# Patient Record
Sex: Male | Born: 1965 | Race: Black or African American | Hispanic: No | Marital: Single | Smoking: Former smoker
Health system: Southern US, Community
[De-identification: ages and names within clinical notes are randomized; demographics above are authoritative.]

## PROBLEM LIST (undated history)

## (undated) DIAGNOSIS — C679 Malignant neoplasm of bladder, unspecified: Secondary | ICD-10-CM

## (undated) DIAGNOSIS — J45909 Unspecified asthma, uncomplicated: Secondary | ICD-10-CM

## (undated) HISTORY — PX: BLADDER SURGERY: SHX569

---

## 2014-07-02 ENCOUNTER — Encounter (HOSPITAL_BASED_OUTPATIENT_CLINIC_OR_DEPARTMENT_OTHER): Payer: Self-pay

## 2014-07-02 ENCOUNTER — Emergency Department (HOSPITAL_BASED_OUTPATIENT_CLINIC_OR_DEPARTMENT_OTHER)
Admission: EM | Admit: 2014-07-02 | Discharge: 2014-07-02 | Disposition: A | Discharge status: Home / Self Care | Payer: PRIVATE HEALTH INSURANCE | Attending: Emergency Medicine | Admitting: Emergency Medicine

## 2014-07-02 ENCOUNTER — Emergency Department (HOSPITAL_BASED_OUTPATIENT_CLINIC_OR_DEPARTMENT_OTHER): Payer: PRIVATE HEALTH INSURANCE

## 2014-07-02 DIAGNOSIS — R1030 Lower abdominal pain, unspecified: Secondary | ICD-10-CM | POA: Insufficient documentation

## 2014-07-02 DIAGNOSIS — R319 Hematuria, unspecified: Secondary | ICD-10-CM

## 2014-07-02 DIAGNOSIS — R109 Unspecified abdominal pain: Secondary | ICD-10-CM

## 2014-07-02 DIAGNOSIS — R1033 Periumbilical pain: Secondary | ICD-10-CM

## 2014-07-02 DIAGNOSIS — N3289 Other specified disorders of bladder: Secondary | ICD-10-CM | POA: Insufficient documentation

## 2014-07-02 DIAGNOSIS — Z87891 Personal history of nicotine dependence: Secondary | ICD-10-CM | POA: Diagnosis not present

## 2014-07-02 DIAGNOSIS — Z79899 Other long term (current) drug therapy: Secondary | ICD-10-CM | POA: Insufficient documentation

## 2014-07-02 DIAGNOSIS — J45909 Unspecified asthma, uncomplicated: Secondary | ICD-10-CM | POA: Insufficient documentation

## 2014-07-02 HISTORY — DX: Unspecified asthma, uncomplicated: J45.909

## 2014-07-02 LAB — COMPREHENSIVE METABOLIC PANEL
ALK PHOS: 53 U/L (ref 39–117)
ALT: 59 U/L — ABNORMAL HIGH (ref 0–53)
AST: 45 U/L — ABNORMAL HIGH (ref 0–37)
Albumin: 3.8 g/dL (ref 3.5–5.2)
Anion gap: 6 (ref 5–15)
BILIRUBIN TOTAL: 0.3 mg/dL (ref 0.3–1.2)
BUN: 8 mg/dL (ref 6–23)
CALCIUM: 9.1 mg/dL (ref 8.4–10.5)
CHLORIDE: 102 mmol/L (ref 96–112)
CO2: 27 mmol/L (ref 19–32)
CREATININE: 1.09 mg/dL (ref 0.50–1.35)
GFR calc non Af Amer: 77 mL/min — ABNORMAL LOW (ref >90)
GFR, EST AFRICAN AMERICAN: 89 mL/min — AB (ref >90)
Glucose, Bld: 128 mg/dL — ABNORMAL HIGH (ref 70–99)
Potassium: 3.4 mmol/L — ABNORMAL LOW (ref 3.5–5.1)
Sodium: 135 mmol/L (ref 135–145)
TOTAL PROTEIN: 7.8 g/dL (ref 6.0–8.3)

## 2014-07-02 LAB — CBC WITH DIFFERENTIAL/PLATELET
Basophils Absolute: 0 10*3/uL (ref 0.0–0.1)
Basophils Relative: 0 % (ref 0–1)
EOS ABS: 0.3 10*3/uL (ref 0.0–0.7)
Eosinophils Relative: 4 % (ref 0–5)
HCT: 41.1 % (ref 39.0–52.0)
Hemoglobin: 14.4 g/dL (ref 13.0–17.0)
LYMPHS PCT: 32 % (ref 12–46)
Lymphs Abs: 2.4 10*3/uL (ref 0.7–4.0)
MCH: 31.4 pg (ref 26.0–34.0)
MCHC: 35 g/dL (ref 30.0–36.0)
MCV: 89.7 fL (ref 78.0–100.0)
MONO ABS: 0.8 10*3/uL (ref 0.1–1.0)
MONOS PCT: 10 % (ref 3–12)
NEUTROS ABS: 4 10*3/uL (ref 1.7–7.7)
Neutrophils Relative %: 54 % (ref 43–77)
PLATELETS: 215 10*3/uL (ref 150–400)
RBC: 4.58 MIL/uL (ref 4.22–5.81)
RDW: 13.1 % (ref 11.5–15.5)
WBC: 7.5 10*3/uL (ref 4.0–10.5)

## 2014-07-02 LAB — URINE MICROSCOPIC-ADD ON

## 2014-07-02 LAB — URINALYSIS, ROUTINE W REFLEX MICROSCOPIC
Bilirubin Urine: NEGATIVE
Glucose, UA: NEGATIVE mg/dL
Ketones, ur: NEGATIVE mg/dL
Leukocytes, UA: NEGATIVE
NITRITE: NEGATIVE
Protein, ur: NEGATIVE mg/dL
SPECIFIC GRAVITY, URINE: 1.008 (ref 1.005–1.030)
Urobilinogen, UA: 0.2 mg/dL (ref 0.0–1.0)
pH: 7 (ref 5.0–8.0)

## 2014-07-02 LAB — AMYLASE: Amylase: 72 U/L (ref 0–105)

## 2014-07-02 LAB — LIPASE, BLOOD: Lipase: 39 U/L (ref 11–59)

## 2014-07-02 MED ORDER — HYDROCODONE-ACETAMINOPHEN 5-325 MG PO TABS
1.0000 | ORAL_TABLET | Freq: Four times a day (QID) | ORAL | Status: DC | PRN
Start: 2014-07-02 — End: 2017-05-23

## 2014-07-02 MED ORDER — CIPROFLOXACIN HCL 500 MG PO TABS: 500.0000 mg | ORAL_TABLET | Freq: Two times a day (BID) | ORAL | Status: DC

## 2014-07-02 MED ORDER — IOHEXOL 300 MG/ML  SOLN
100.0000 mL | Freq: Once | INTRAMUSCULAR | Status: AC | PRN
  Administered 2014-07-02: 100 mL via INTRAVENOUS

## 2014-07-02 MED ORDER — SODIUM CHLORIDE 0.9 % IV SOLN: INTRAVENOUS | Status: DC

## 2014-07-02 MED ORDER — SODIUM CHLORIDE 0.9 % IV SOLN
INTRAVENOUS | Status: DC
  Administered 2014-07-02: 14:00:00 via INTRAVENOUS

## 2014-07-02 MED ORDER — HYDROMORPHONE HCL 1 MG/ML IJ SOLN
1.0000 mg | Freq: Once | INTRAMUSCULAR | Status: AC
  Administered 2014-07-02: 1 mg via INTRAVENOUS
  Filled 2014-07-02: qty 1

## 2014-07-02 MED ORDER — IOHEXOL 300 MG/ML  SOLN
25.0000 mL | Freq: Once | INTRAMUSCULAR | Status: AC | PRN
  Administered 2014-07-02: 25 mL via ORAL

## 2014-07-02 MED ORDER — ONDANSETRON HCL 4 MG/2ML IJ SOLN
4.0000 mg | Freq: Once | INTRAMUSCULAR | Status: AC
  Administered 2014-07-02: 4 mg via INTRAVENOUS
  Filled 2014-07-02: qty 2

## 2014-07-02 MED ORDER — SODIUM CHLORIDE 0.9 % IV BOLUS (SEPSIS)
250.0000 mL | Freq: Once | INTRAVENOUS | Status: AC
  Administered 2014-07-02: 250 mL via INTRAVENOUS

## 2014-07-02 NOTE — ED Provider Notes (Addendum)
CSN: [id number]     Arrival date & time 07/02/14  1142 History   First MD Initiated Contact with Patient 07/02/14 1305     Chief Complaint  Patient presents with  . Hematuria     (Consider location/radiation/quality/duration/timing/severity/associated sxs/prior Treatment) Patient is a 49 y.o. male presenting with hematuria. The history is provided by the patient.  Hematuria Associated symptoms include abdominal pain. Pertinent negatives include no chest pain, no headaches and no shortness of breath.   patient with complaint of difficulty urination for a week and I was seen at no [facility] urgent care today noted to have hematuria. Patient also with complaint of abdominal pain suprapubic up to the periumbilical area for the past week. No nausea vomiting or diarrhea. Patient with a history of heavy alcohol use. Patient denies any radiation of the pain to the back. Patient has a feeling of not being able to completely empty his bladder.  Past Medical History  Diagnosis Date  . Asthma    History reviewed. No pertinent past surgical history. No family history on file. History  Substance Use Topics  . Smoking status: Former Research scientist (life sciences)  . Smokeless tobacco: Not on file  . Alcohol Use: Yes    Review of Systems  Constitutional: Negative for fever.  HENT: Negative for congestion.   Eyes: Negative for redness.  Respiratory: Negative for shortness of breath.   Cardiovascular: Negative for chest pain.  Gastrointestinal: Positive for abdominal pain. Negative for nausea and vomiting.  Genitourinary: Positive for hematuria and difficulty urinating.  Musculoskeletal: Negative for back pain.  Skin: Negative for rash.  Neurological: Negative for headaches.  Hematological: Does not bruise/bleed easily.  Psychiatric/Behavioral: Negative for confusion.      Allergies  Review of patient's allergies indicates no known allergies.  Home Medications   Prior to Admission medications   Medication Sig  Start Date End Date Taking? Authorizing Provider  ALBUTEROL IN Inhale into the lungs.   Yes Historical Provider, MD  ciprofloxacin (CIPRO) 500 MG tablet Take 1 tablet (500 mg total) by mouth 2 (two) times daily. 07/02/14   Fredia Sorrow, MD  HYDROcodone-acetaminophen (NORCO/VICODIN) 5-325 MG per tablet Take 1-2 tablets by mouth every 6 (six) hours as needed for moderate pain. 07/02/14   Fredia Sorrow, MD   BP 126/83 mmHg  Pulse 52  Temp(Src) 98.1 F (36.7 C) (Oral)  Resp 18  Ht 5\' 4"  (1.626 m)  Wt 235 lb (106.595 kg)  BMI 40.32 kg/m2  SpO2 98% Physical Exam  Constitutional: He is oriented to person, place, and time. He appears well-developed and well-nourished. No distress.  HENT:  Head: Normocephalic and atraumatic.  Mouth/Throat: Oropharynx is clear and moist.  Eyes: Conjunctivae and EOM are normal. Pupils are equal, round, and reactive to light.  Neck: Normal range of motion.  Cardiovascular: Normal rate, regular rhythm and normal heart sounds.   No murmur heard. Pulmonary/Chest: Effort normal and breath sounds normal.  Abdominal: Soft. Bowel sounds are normal. There is tenderness.  Musculoskeletal: Normal range of motion.  Neurological: He is alert and oriented to person, place, and time. No cranial nerve deficit. He exhibits normal muscle tone. Coordination normal.  Skin: Skin is warm.  Nursing note and vitals reviewed.   ED Course  Procedures (including critical care time) Labs Review Labs Reviewed  COMPREHENSIVE METABOLIC PANEL - Abnormal; Notable for the following:    Potassium 3.4 (*)    Glucose, Bld 128 (*)    AST 45 (*)    ALT 59 (*)  GFR calc non Af Amer 77 (*)    GFR calc Af Amer 89 (*)    All other components within normal limits  URINALYSIS, ROUTINE W REFLEX MICROSCOPIC - Abnormal; Notable for the following:    Hgb urine dipstick LARGE (*)    All other components within normal limits  CBC WITH DIFFERENTIAL/PLATELET  AMYLASE  LIPASE, BLOOD  URINE  MICROSCOPIC-ADD ON    Imaging Review Ct Abdomen Pelvis W Contrast  07/02/2014   CLINICAL DATA:  Periumbilical pain.  Hematuria  EXAM: CT ABDOMEN AND PELVIS WITH CONTRAST  TECHNIQUE: Multidetector CT imaging of the abdomen and pelvis was performed using the standard protocol following bolus administration of intravenous contrast.  CONTRAST:  49mL OMNIPAQUE IOHEXOL 300 MG/ML SOLN, 119mL OMNIPAQUE IOHEXOL 300 MG/ML SOLN  COMPARISON:  None.  FINDINGS: Lung bases are clear.  Heart size normal.  Liver is diffusely low attenuation consistent with fatty infiltration. Gallbladder and bile ducts normal. Pancreas is normal without edema or mass. Spleen is normal  Normal kidneys. No renal mass or obstruction. No renal calculi. Mild prostate enlargement. 2 cm irregular density in the base of the bladder which may represent blood clot or neoplasm. Given the history of hematuria, urologic evaluation is warranted to exclude bladder lesion.  Negative for bowel obstruction. No bowel edema identified. Appendix questionably visualized and appears normal.  Negative for free fluid.  No adenopathy.  No acute bony change.  IMPRESSION: Diffuse fatty infiltration of the liver  Negative for appendicitis. The appendix is difficult to see but is probably identified and appears normal.  2 cm bladder lesion may represent blood or tumor in the bladder. Urologic evaluation suggested.   Electronically Signed   By: Franchot Gallo M.D.   On: 07/02/2014 14:39     EKG Interpretation None      MDM   Final diagnoses:  Abdominal pain  Hematuria  Mass of bladder    Patient with complaint of suprapubic abdominal pain and periumbilical pain feeling like he is not voiding completely. And just recently noticed some blood in his urine. Patient has a history of being a heavy drinker.  Workup here her function tests without significant abnormalities. Urine consistent with hematuria. Not distinct for urinary tract infection. Urine culture  sent. CT scan now raises concerns for a mass in the bladder. This would explain the hematuria.  Patient be started on antibiotic Cipro given pain medicine and the follow-up with urology. Patient's daughter understands the need for prompt follow-up. She will call and make an appointment.  In addition patient without evidence of urinary retention. Despite him feeling like the bladder was full he only had 160 mL of urine on bladder scan. No evidence of over bladder distention.  Fredia Sorrow, MD 07/02/14 [place], MD 07/10/14 [phone]

## 2014-07-02 NOTE — Discharge Instructions (Signed)
Workup for the blood in the urine shows concern for possible bladder tumor. Prompt follow-up with urology will be important. In the meantime take the antibiotics and pain medicine as directed. No other acute findings.

## 2014-07-02 NOTE — ED Notes (Signed)
Pt returned from ct

## 2014-07-02 NOTE — ED Notes (Signed)
C/o difficult urination x 1 week-was seen at [facility] to ED for hematuria and abd pain

## 2014-07-02 NOTE — ED Notes (Signed)
Family at bedside. 

## 2014-07-02 NOTE — ED Notes (Signed)
Pt reports he developed abdominal pain that is described as moderate 1 week ago and radiates to lower abdomen. Pain has progressively worsened over the past 2 days and he developed hematuria as well.  Denies nausea/vomiting/diarrhea, or melena. No previous history of similar episode. He has a history of constipation but tells me he doesn't feel constipated today.  Abdomen is soft, however more distended than usual. He was seen at an urgent care [facility] and had a gross amount of blood in urinalysis; sent to ED for further evaluation.

## 2014-07-02 NOTE — ED Notes (Signed)
Bladder scan performed 173ml noted.  EDP informed.

## 2014-07-02 NOTE — ED Notes (Signed)
Daughter presents to the desk to request that pt drinks "a lot of energy drinks daily" and she feels the blood in his urine is related to the consumption of energy drinks.

## 2014-07-02 NOTE — ED Notes (Signed)
Pt tells me he has a 20+ year of etoh abuse and drinks approx 1/5 of vodka daily.  Last drink was 5 days ago.

## 2017-05-23 ENCOUNTER — Other Ambulatory Visit: Payer: Self-pay

## 2017-05-23 ENCOUNTER — Emergency Department (HOSPITAL_BASED_OUTPATIENT_CLINIC_OR_DEPARTMENT_OTHER)
Admission: EM | Admit: 2017-05-23 | Discharge: 2017-05-24 | Disposition: A | Discharge status: Home / Self Care | Payer: BLUE CROSS/BLUE SHIELD | Attending: Emergency Medicine | Admitting: Emergency Medicine

## 2017-05-23 ENCOUNTER — Emergency Department (HOSPITAL_BASED_OUTPATIENT_CLINIC_OR_DEPARTMENT_OTHER): Payer: BLUE CROSS/BLUE SHIELD

## 2017-05-23 ENCOUNTER — Encounter (HOSPITAL_BASED_OUTPATIENT_CLINIC_OR_DEPARTMENT_OTHER): Payer: Self-pay

## 2017-05-23 DIAGNOSIS — M19011 Primary osteoarthritis, right shoulder: Secondary | ICD-10-CM | POA: Insufficient documentation

## 2017-05-23 DIAGNOSIS — J45909 Unspecified asthma, uncomplicated: Secondary | ICD-10-CM | POA: Diagnosis not present

## 2017-05-23 DIAGNOSIS — Z87891 Personal history of nicotine dependence: Secondary | ICD-10-CM | POA: Diagnosis not present

## 2017-05-23 DIAGNOSIS — M160 Bilateral primary osteoarthritis of hip: Secondary | ICD-10-CM | POA: Diagnosis not present

## 2017-05-23 DIAGNOSIS — M25552 Pain in left hip: Secondary | ICD-10-CM | POA: Diagnosis present

## 2017-05-23 HISTORY — DX: Malignant neoplasm of bladder, unspecified: C67.9

## 2017-05-23 LAB — CBC WITH DIFFERENTIAL/PLATELET
Basophils Absolute: 0.1 10*3/uL (ref 0.0–0.1)
Basophils Relative: 1 %
EOS PCT: 3 %
Eosinophils Absolute: 0.3 10*3/uL (ref 0.0–0.7)
HCT: 46.1 % (ref 39.0–52.0)
Hemoglobin: 15.8 g/dL (ref 13.0–17.0)
LYMPHS PCT: 32 %
Lymphs Abs: 3 10*3/uL (ref 0.7–4.0)
MCH: 31.7 pg (ref 26.0–34.0)
MCHC: 34.3 g/dL (ref 30.0–36.0)
MCV: 92.6 fL (ref 78.0–100.0)
Monocytes Absolute: 1 10*3/uL (ref 0.1–1.0)
Monocytes Relative: 11 %
Neutro Abs: 5 10*3/uL (ref 1.7–7.7)
Neutrophils Relative %: 53 %
Platelets: 218 10*3/uL (ref 150–400)
RBC: 4.98 MIL/uL (ref 4.22–5.81)
RDW: 14 % (ref 11.5–15.5)
WBC: 9.3 10*3/uL (ref 4.0–10.5)

## 2017-05-23 LAB — COMPREHENSIVE METABOLIC PANEL
ALBUMIN: 3.6 g/dL (ref 3.5–5.0)
ALK PHOS: 64 U/L (ref 38–126)
ALT: 22 U/L (ref 17–63)
AST: 32 U/L (ref 15–41)
Anion gap: 10 (ref 5–15)
BILIRUBIN TOTAL: 0.7 mg/dL (ref 0.3–1.2)
BUN: 14 mg/dL (ref 6–20)
CO2: 25 mmol/L (ref 22–32)
CREATININE: 1.1 mg/dL (ref 0.61–1.24)
Calcium: 8.8 mg/dL — ABNORMAL LOW (ref 8.9–10.3)
Chloride: 99 mmol/L — ABNORMAL LOW (ref 101–111)
GFR calc Af Amer: >60 mL/min (ref >60)
GFR calc non Af Amer: >60 mL/min (ref >60)
GLUCOSE: 122 mg/dL — AB (ref 65–99)
Potassium: 3.3 mmol/L — ABNORMAL LOW (ref 3.5–5.1)
Sodium: 134 mmol/L — ABNORMAL LOW (ref 135–145)
TOTAL PROTEIN: 7.5 g/dL (ref 6.5–8.1)

## 2017-05-23 LAB — CK: Total CK: 730 U/L — ABNORMAL HIGH (ref 49–397)

## 2017-05-23 MED ORDER — SODIUM CHLORIDE 0.9 % IV BOLUS (SEPSIS)
1000.0000 mL | Freq: Once | INTRAVENOUS | Status: AC
  Administered 2017-05-23: 1000 mL via INTRAVENOUS

## 2017-05-23 MED ORDER — NAPROXEN 500 MG PO TABS: 500.0000 mg | ORAL_TABLET | Freq: Two times a day (BID) | ORAL | 0 refills | Status: AC

## 2017-05-23 MED ORDER — KETOROLAC TROMETHAMINE 60 MG/2ML IM SOLN
60.0000 mg | Freq: Once | INTRAMUSCULAR | Status: AC
  Administered 2017-05-23: 60 mg via INTRAMUSCULAR
  Filled 2017-05-23: qty 2

## 2017-05-23 MED ORDER — POTASSIUM CHLORIDE CRYS ER 20 MEQ PO TBCR
40.0000 meq | EXTENDED_RELEASE_TABLET | Freq: Once | ORAL | Status: AC
  Administered 2017-05-23: 40 meq via ORAL
  Filled 2017-05-23: qty 2

## 2017-05-23 NOTE — ED Notes (Signed)
ED Provider at bedside. 

## 2017-05-23 NOTE — ED Provider Notes (Signed)
[facility] EMERGENCY DEPARTMENT Provider Note   CSN: [id number] Arrival date & time: 05/23/17  1929     History   Chief Complaint Chief Complaint  Patient presents with  . Generalized Body Aches    HPI Aaron Valentine is a 52 y.o. male.  HPI  52 year old male with a history of bladder cancer status post surgery presents with arthralgias.  This is been ongoing for about 3 weeks.  He states that he saw Dr. on 2/10 and was prescribed medicine but it did not help.  He is not sure why he was prescribed but knows he was prescribed an antibiotic.  The worst of the pain is in his right elbow.  Hurts to worse to move it.  He is noticed some on and off pain in the muscles of his chest as well as in his bilateral hips.  He has been taking ibuprofen, 2 tablets with no relief.  Typically takes this first thing in the morning.  He denies any fevers, headache, vomiting, cough or shortness of breath.  Pain is severe. No joint swelling.  Past Medical History:  Diagnosis Date  . Asthma   . Bladder cancer ([place])     There are no active problems to display for this patient.   Past Surgical History:  Procedure Laterality Date  . BLADDER SURGERY         Home Medications    Prior to Admission medications   Medication Sig Start Date End Date Taking? Authorizing Provider  UNKNOWN TO PATIENT    Yes [provider]  ALBUTEROL IN Inhale into the lungs.    [provider]  naproxen (NAPROSYN) 500 MG tablet Take 1 tablet (500 mg total) by mouth 2 (two) times daily with a meal. 05/25/17   Sherwood Gambler, MD    Family History No family history on file.  Social History Social History   Tobacco Use  . Smoking status: Former Research scientist (life sciences)  . Smokeless tobacco: Never Used  Substance Use Topics  . Alcohol use: Yes    Comment: weekly  . Drug use: No     Allergies   Patient has no known allergies.   Review of Systems Review of Systems  Constitutional: Negative for  fever.  Respiratory: Negative for shortness of breath.   Cardiovascular: Positive for chest pain.  Gastrointestinal: Negative for abdominal pain and vomiting.  Musculoskeletal: Positive for arthralgias. Negative for joint swelling and neck pain.  Neurological: Negative for weakness and headaches.  All other systems reviewed and are negative.    Physical Exam Updated Vital Signs BP 131/87   Pulse (!) 55   Temp 97.8 F (36.6 C) (Oral)   Resp 20   Ht 5\' 4"  (1.626 m)   Wt 104 kg (229 lb 4.5 oz)   SpO2 100%   BMI 39.36 kg/m   Physical Exam  Constitutional: He is oriented to person, place, and time. He appears well-developed and well-nourished. No distress.  obese  HENT:  Head: Normocephalic and atraumatic.  Right Ear: External ear normal.  Left Ear: External ear normal.  Nose: Nose normal.  Eyes: Right eye exhibits no discharge. Left eye exhibits no discharge.  Neck: Neck supple.  Cardiovascular: Normal rate, regular rhythm and normal heart sounds.  Pulses:      Radial pulses are 2+ on the right side, and 2+ on the left side.       Dorsalis pedis pulses are 2+ on the right side, and 2+ on the left  side.  Pulmonary/Chest: Effort normal and breath sounds normal. He exhibits tenderness (mild diffuse).  Abdominal: Soft. There is no tenderness.  Musculoskeletal: He exhibits no edema.       Right shoulder: He exhibits tenderness (anterior) and bony tenderness. He exhibits normal range of motion (but painful) and no deformity.       Right elbow: He exhibits normal range of motion. No tenderness found.       Right hip: He exhibits tenderness (mild). He exhibits normal range of motion.       Left hip: He exhibits tenderness (mild). He exhibits normal range of motion.       Right upper arm: He exhibits tenderness (lateral, distal). He exhibits no swelling.  No focal joint swelling in shoulders, hips, elbows, or knees.  Neurological: He is alert and oriented to person, place, and time.    Skin: Skin is warm and dry. He is not diaphoretic.  Nursing note and vitals reviewed.    ED Treatments / Results  Labs (all labs ordered are listed, but only abnormal results are displayed) Labs Reviewed  COMPREHENSIVE METABOLIC PANEL - Abnormal; Notable for the following components:      Result Value   Sodium 134 (*)    Potassium 3.3 (*)    Chloride 99 (*)    Glucose, Bld 122 (*)    Calcium 8.8 (*)    All other components within normal limits  CK - Abnormal; Notable for the following components:   Total CK 730 (*)    All other components within normal limits  CBC WITH DIFFERENTIAL/PLATELET    EKG  EKG Interpretation  Date/Time:  Tuesday May 23 2017 21:45:52 EST Ventricular Rate:  58 PR Interval:    QRS Duration: 89 QT Interval:  432 QTC Calculation: 425 R Axis:   59 Text Interpretation:  Sinus rhythm no acute ST/T changes No old tracing to compare Confirmed by Sherwood Gambler [phone]) on 05/23/2017 10:43:32 PM       Radiology Dg Chest 2 View  Result Date: 05/23/2017 CLINICAL DATA:  Pain in bones and joints. EXAM: CHEST  2 VIEW COMPARISON:  None. FINDINGS: Cardiomediastinal silhouette is normal. Mediastinal contours appear intact. There is no evidence of focal airspace consolidation, pleural effusion or pneumothorax. Osseous structures are without acute abnormality. 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 Soft tissues are grossly normal. IMPRESSION: No active cardiopulmonary disease. Electronically Signed   By: Fidela Salisbury M.D.   On: 05/23/2017 22:25   Dg Pelvis 1-2 Views  Result Date: 05/23/2017 CLINICAL DATA:  Bone and joint pain. EXAM: PELVIS - 1-2 VIEW COMPARISON:  Body CT 07/02/2014 FINDINGS: There is no evidence of pelvic fracture or diastasis. Moderate osteoarthritic changes of bilateral hips with joint space narrowing, subchondral sclerosis, subchondral cyst formation and acetabular osteophyte formation. IMPRESSION:  No acute fracture or dislocation identified about the pelvis. Osteoarthritic changes of bilateral hips. Electronically Signed   By: Fidela Salisbury M.D.   On: 05/23/2017 22:28   Dg Shoulder Right  Result Date: 05/23/2017 CLINICAL DATA:  Worsening right shoulder pain. EXAM: RIGHT SHOULDER - 2+ VIEW COMPARISON:  None. FINDINGS: Osteoarthritis of the [facility] and glenohumeral joints with undersurface spurring off the [facility] joint and slight narrowing of the impingement space for the supraspinatus tendon noted. Tug related insertional changes of the deltoid muscle in the proximal humeral shaft. No acute fracture or suspicious osseous lesions. IMPRESSION: AC and glenohumeral joint osteoarthritis with narrowing of the impingement space for the  supraspinatus tendon secondary to slight lateral downsloping of the acromion and undersurface spurring about the [facility] joint. Electronically Signed   By: Ashley Royalty M.D.   On: 05/23/2017 22:32   Dg Humerus Right  Result Date: 05/23/2017 CLINICAL DATA:  Bone and joint pain times 2-3 weeks without known injury. EXAM: RIGHT HUMERUS - 2+ VIEW COMPARISON:  None. FINDINGS: Osteoarthritis of the glenohumeral and AC joints with narrowing of the impingement space for the supraspinatus tendon due to slight lateral downsloping of the acromion. Undersurface spurring is also noted of the [facility] joint. The humerus appears intact. No joint dislocation is seen. The adjacent ribs and lung are nonacute. IMPRESSION: AC and glenohumeral joint osteoarthritis. Undersurface spurring and slight lateral downsloping of the acromion likely contribute to impingement on the rotator cuff. No acute osseous abnormality. Electronically Signed   By: Ashley Royalty M.D.   On: 05/23/2017 22:30    Procedures Procedures (including critical care time)  Medications Ordered in ED Medications  sodium chloride 0.9 % bolus 1,000 mL (1,000 mLs Intravenous New Bag/Given 05/23/17 2215)  ketorolac (TORADOL) injection 60 mg (60  mg Intramuscular Given 05/23/17 2215)  sodium chloride 0.9 % bolus 1,000 mL (0 mLs Intravenous Stopped 05/24/17 0005)  potassium chloride SA (K-DUR,KLOR-CON) CR tablet 40 mEq (40 mEq Oral Given 05/23/17 2250)     Initial Impression / Assessment and Plan / ED Course  I have reviewed the triage vital signs and the nursing notes.  Pertinent labs & imaging results that were available during my care of the patient were reviewed by me and considered in my medical decision making (see chart for details).     Patient symptoms are consistent with arthritis.  Given his history of cancer, x-rays were obtained to help rule out obvious bony metastasis.  These are consistent only with osteoarthritis, worst in his right shoulder which is consistent with his degree of pain.  I have discussed he will need referral to orthopedics.  He will also need PCP follow-up for workup of his osteoarthritis to make sure there is no other type of arthritis such as rheumatoid arthritis.  Mild hypokalemia repleted in the ED.  He has a mild CK elevation of 700 but given normal renal function my suspicion for significant rhabdomyolysis is quite low.  He was ordered 60 IM Toradol but the nurse accidentally gave this IV.  He was made aware of this accidental dosing error.  Counseled on possible potential side effects and discussed return precautions.  However he otherwise appears stable for discharge home.  His chest pain appears to be more chest wall pain.  It is reproducible in his muscles.  I discussed that he will still need NSAIDs for the arthritis but not to start for another 24 hours. F/u with a PCP.  Final Clinical Impressions(s) / ED Diagnoses   Final diagnoses:  Osteoarthritis of both hips, unspecified osteoarthritis type  Osteoarthritis of right shoulder, unspecified osteoarthritis type    ED Discharge Orders        Ordered    naproxen (NAPROSYN) 500 MG tablet  2 times daily with meals     05/23/17 2333         Sherwood Gambler, MD 05/24/17 0009

## 2017-05-23 NOTE — ED Notes (Signed)
Pt returned from X-ray.  

## 2017-05-23 NOTE — ED Notes (Signed)
Pt made aware of the need of a urine sample.

## 2017-05-23 NOTE — Discharge Instructions (Signed)
You were accidentally given extra TORADOL through your IV. If you develop chest pain, abdominal pain, bleeding, headache or other new/concerning symptoms, return to the ER immediately. Otherwise your xrays show arthritis, and you need to follow up with an orthopedist. You will also need a primary care doctor for workup of your arthritis. Do not take ibuprofen, advil, aleve or other NSAIDs while on the naproxen. Do not start the naproxen until 05/25/17

## 2017-05-23 NOTE — ED Notes (Signed)
Patient transported to X-ray 

## 2017-05-23 NOTE — ED Triage Notes (Signed)
C/o pain "to my muscles and my joints" x 2 weeks-denies fever and flu like sx-NAD-steady gait

## 2018-02-08 ENCOUNTER — Other Ambulatory Visit (HOSPITAL_COMMUNITY): Payer: Self-pay

## 2022-09-18 ENCOUNTER — Inpatient Hospital Stay
Admit: 2022-09-18 | Discharge: 2022-09-18 | Disposition: A | Discharge status: Home / Self Care | Payer: BLUE CROSS/BLUE SHIELD | Attending: Emergency Medicine

## 2022-09-18 DIAGNOSIS — F10120 Alcohol abuse with intoxication, uncomplicated: Secondary | ICD-10-CM

## 2022-09-18 LAB — COMPREHENSIVE METABOLIC PANEL
ALT: 24 U/L (ref 0–50)
AST: 21 U/L (ref 0–50)
Albumin/Globulin Ratio: 1 (ref 1.00–2.70)
Albumin: 4.2 g/dL (ref 3.5–5.2)
Alk Phosphatase: 76 U/L (ref 40–130)
Anion Gap: 15 mmol/L (ref 2–17)
BUN: 9 mg/dL (ref 6–20)
CO2: 20 mmol/L — ABNORMAL LOW (ref 22–29)
Calcium: 9.6 mg/dL (ref 8.5–10.7)
Chloride: 105 mmol/L (ref 98–107)
Creatinine: 0.9 mg/dL (ref 0.7–1.3)
Est, Glom Filt Rate: 98 mL/min/1.73m (ref >=60)
Globulin: 4.2 g/dL (ref 1.9–4.4)
Glucose: 147 mg/dL — ABNORMAL HIGH (ref 70–99)
Osmolaliy Calculated: 281 mOsm/kg (ref 270–287)
Potassium: 3.9 mmol/L (ref 3.5–5.3)
Sodium: 140 mmol/L (ref 135–145)
Total Bilirubin: 0.4 mg/dL (ref 0.00–1.20)
Total Protein: 8.4 g/dL — ABNORMAL HIGH (ref 5.7–8.3)

## 2022-09-18 LAB — CBC
Hematocrit: 44.5 % (ref 38.0–52.0)
Hemoglobin: 15.3 g/dL (ref 13.0–17.3)
MCH: 31 pg (ref 27.0–34.5)
MCHC: 34.4 g/dL (ref 32.0–36.0)
MCV: 90.3 fL (ref 84.0–100.0)
MPV: 9.2 fL (ref 7.2–13.2)
NRBC Absolute: 0 10*3/uL (ref 0.000–0.012)
NRBC Automated: 0 % (ref 0.0–0.2)
Platelets: 237 10*3/uL (ref 140–440)
RBC: 4.93 x10e6/mcL (ref 4.00–5.60)
RDW: 13.2 % (ref 11.0–16.0)
WBC: 7.9 10*3/uL (ref 3.8–10.6)

## 2022-09-18 LAB — ETHANOL: Ethanol Lvl: 198.9 mg/dL — ABNORMAL HIGH (ref 0.0–10.0)

## 2022-09-18 NOTE — ED Notes (Signed)
Pt requested to place wallet and phone in his book bag. This tech explained the potential risk of loss if items are placed in safe bag/safe. 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 Pt was agreeable to sign safe bag stating items are in his book bag in closet at this time with potential risk.
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
## 2022-09-18 NOTE — ED Notes (Signed)
Pt. Refused to participate in [facility] screening, stating "this is where they get you, this is just like AA", RN told pt. This is standard screening for pts, he proceeded to get louder and speak over me. Pt. Stated "fine go ahead I'll play the game", RN told pt. She would return later when he had a chance to calm down. Security at bedside. MD aware.
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
## 2022-09-18 NOTE — ED Notes (Signed)
Safety precautions initiated.    Security at bedside.    Patient changed into paper scrubs with presence of two staff members.    Patient searched and wanded by security.    Patient belongings/valuables removed and secured per protocol.    Environment of Care assessment completed.  All moveable equipment, hazardous items and ligature risks removed from patient room.

## 2022-09-18 NOTE — ED Notes (Signed)
Pt. Walked out before dc papers could be given and last set of vitals.
